# Patient Record
Sex: Female | Born: 1970 | Race: White | Hispanic: No | Marital: Married | Smoking: Never smoker
Health system: Southern US, Community
[De-identification: ages and names within clinical notes are randomized; demographics above are authoritative.]

## PROBLEM LIST (undated history)

## (undated) DIAGNOSIS — B009 Herpesviral infection, unspecified: Secondary | ICD-10-CM

## (undated) DIAGNOSIS — C801 Malignant (primary) neoplasm, unspecified: Secondary | ICD-10-CM

## (undated) DIAGNOSIS — F329 Major depressive disorder, single episode, unspecified: Secondary | ICD-10-CM

## (undated) DIAGNOSIS — F32A Depression, unspecified: Secondary | ICD-10-CM

## (undated) HISTORY — DX: Depression, unspecified: F32.A

## (undated) HISTORY — PX: WISDOM TOOTH EXTRACTION: SHX21

## (undated) HISTORY — PX: SHOULDER SURGERY: SHX246

## (undated) HISTORY — PX: HIP SURGERY: SHX245

## (undated) HISTORY — DX: Major depressive disorder, single episode, unspecified: F32.9

## (undated) HISTORY — DX: Herpesviral infection, unspecified: B00.9

---

## 2001-03-13 HISTORY — PX: LAPAROSCOPIC ABDOMINAL EXPLORATION: SHX6249

## 2003-03-14 HISTORY — PX: ABDOMINAL HYSTERECTOMY: SHX81

## 2003-11-30 ENCOUNTER — Encounter: Admission: RE | Admit: 2003-11-30 | Discharge: 2003-11-30 | Payer: Self-pay | Admitting: Occupational Medicine

## 2004-08-10 ENCOUNTER — Emergency Department: Payer: Self-pay | Admitting: Emergency Medicine

## 2010-03-13 HISTORY — PX: COLONOSCOPY: SHX174

## 2010-03-29 ENCOUNTER — Ambulatory Visit: Payer: Self-pay | Admitting: Unknown Physician Specialty

## 2010-04-11 ENCOUNTER — Ambulatory Visit: Payer: Self-pay | Admitting: Gastroenterology

## 2011-07-21 ENCOUNTER — Ambulatory Visit: Payer: Self-pay | Admitting: Family Medicine

## 2011-10-31 ENCOUNTER — Emergency Department: Payer: Self-pay | Admitting: Emergency Medicine

## 2011-12-21 ENCOUNTER — Ambulatory Visit: Payer: Self-pay | Admitting: Unknown Physician Specialty

## 2014-09-10 DIAGNOSIS — F5101 Primary insomnia: Secondary | ICD-10-CM | POA: Insufficient documentation

## 2015-04-07 ENCOUNTER — Inpatient Hospital Stay
Admission: RE | Admit: 2015-04-07 | Discharge: 2015-04-07 | Disposition: A | Discharge status: Home / Self Care | Payer: Self-pay | Source: Ambulatory Visit | Attending: *Deleted | Admitting: *Deleted

## 2015-04-07 ENCOUNTER — Other Ambulatory Visit: Payer: Self-pay | Admitting: *Deleted

## 2015-04-07 DIAGNOSIS — Z9289 Personal history of other medical treatment: Secondary | ICD-10-CM

## 2015-04-08 ENCOUNTER — Other Ambulatory Visit: Payer: Self-pay | Admitting: Obstetrics and Gynecology

## 2015-04-08 DIAGNOSIS — R928 Other abnormal and inconclusive findings on diagnostic imaging of breast: Secondary | ICD-10-CM

## 2015-04-09 ENCOUNTER — Ambulatory Visit
Admission: RE | Admit: 2015-04-09 | Discharge: 2015-04-09 | Disposition: A | Discharge status: Home / Self Care | Payer: 59 | Source: Ambulatory Visit | Attending: Obstetrics and Gynecology | Admitting: Obstetrics and Gynecology

## 2015-04-09 ENCOUNTER — Other Ambulatory Visit: Payer: Self-pay | Admitting: Obstetrics and Gynecology

## 2015-04-09 DIAGNOSIS — N6002 Solitary cyst of left breast: Secondary | ICD-10-CM | POA: Diagnosis not present

## 2015-04-09 DIAGNOSIS — R928 Other abnormal and inconclusive findings on diagnostic imaging of breast: Secondary | ICD-10-CM

## 2015-04-09 DIAGNOSIS — N63 Unspecified lump in breast: Secondary | ICD-10-CM | POA: Diagnosis not present

## 2016-07-04 ENCOUNTER — Other Ambulatory Visit: Payer: Self-pay

## 2016-07-04 ENCOUNTER — Encounter: Payer: Self-pay | Admitting: Obstetrics and Gynecology

## 2016-07-04 MED ORDER — VALACYCLOVIR HCL 1 G PO TABS: ORAL_TABLET | ORAL | 0 refills | Status: DC

## 2016-07-07 ENCOUNTER — Other Ambulatory Visit: Payer: Self-pay | Admitting: Obstetrics & Gynecology

## 2016-07-07 ENCOUNTER — Other Ambulatory Visit: Payer: Self-pay | Admitting: Obstetrics and Gynecology

## 2016-07-07 DIAGNOSIS — F332 Major depressive disorder, recurrent severe without psychotic features: Secondary | ICD-10-CM

## 2016-07-07 MED ORDER — VENLAFAXINE HCL ER 75 MG PO CP24: 225.0000 mg | ORAL_CAPSULE | Freq: Every day | ORAL | 0 refills | Status: DC

## 2016-07-10 ENCOUNTER — Ambulatory Visit (INDEPENDENT_AMBULATORY_CARE_PROVIDER_SITE_OTHER): Payer: 59 | Admitting: Obstetrics and Gynecology

## 2016-07-10 ENCOUNTER — Encounter: Payer: Self-pay | Admitting: Obstetrics and Gynecology

## 2016-07-10 ENCOUNTER — Telehealth: Payer: Self-pay | Admitting: Obstetrics and Gynecology

## 2016-07-10 VITALS — Ht 60.0 in | Wt 150.0 lb

## 2016-07-10 DIAGNOSIS — Z1389 Encounter for screening for other disorder: Secondary | ICD-10-CM

## 2016-07-10 DIAGNOSIS — F332 Major depressive disorder, recurrent severe without psychotic features: Secondary | ICD-10-CM

## 2016-07-10 DIAGNOSIS — Z01419 Encounter for gynecological examination (general) (routine) without abnormal findings: Secondary | ICD-10-CM

## 2016-07-10 DIAGNOSIS — Z1231 Encounter for screening mammogram for malignant neoplasm of breast: Secondary | ICD-10-CM | POA: Diagnosis not present

## 2016-07-10 DIAGNOSIS — F331 Major depressive disorder, recurrent, moderate: Secondary | ICD-10-CM | POA: Diagnosis not present

## 2016-07-10 DIAGNOSIS — Z1339 Encounter for screening examination for other mental health and behavioral disorders: Secondary | ICD-10-CM

## 2016-07-10 DIAGNOSIS — Z1239 Encounter for other screening for malignant neoplasm of breast: Secondary | ICD-10-CM

## 2016-07-10 MED ORDER — VENLAFAXINE HCL ER 75 MG PO CP24: 225.0000 mg | ORAL_CAPSULE | Freq: Every day | ORAL | 3 refills | Status: DC

## 2016-07-10 NOTE — Progress Notes (Signed)
Gynecology Annual Exam  PCP: [facility]  Chief Complaint: Annual Gynecologic Examination  History of Present Illness:  Ms. Hannah Dickerson is a 46 y.o. G2P2 who LMP was No LMP recorded. Patient has had a hysterectomy., presents today for her annual examination.  Her menses are absent due to hysterectomy  She does have vasomotor sx. She uses no meds.  She is single partner, contraception - status post hysterectomy. She does not have vaginal dryness.  Last Pap: prior to hysterectomy, normal Hx of STDs: none  Last mammogram: 1 year ago, BiRads 2 There is no FH of breast cancer. There is no FH of ovarian cancer. The patient does not do self-breast exams.  Colonoscopy: n/a DEXA: has not been screened for osteoporosis  Tobacco use: The patient denies current or previous tobacco use. Alcohol use: social drinker Exercise: moderately active  The patient wears seatbelts: yes.     Review of Systems: Review of Systems  Constitutional: Negative.   HENT: Negative.   Eyes: Negative.   Respiratory: Negative.   Cardiovascular: Negative.   Gastrointestinal: Negative.   Genitourinary: Negative.   Musculoskeletal: Negative.   Skin: Negative.   Neurological: Negative.   Psychiatric/Behavioral: Negative.    Past Medical History:  Diagnosis Date  . Depression   . HSV-1 (herpes simplex virus 1) infection     Past Surgical History:  Procedure Laterality Date  . ABDOMINAL HYSTERECTOMY  2005   TAH-RSO  . COLONOSCOPY  2012  . HIP SURGERY    . LAPAROSCOPIC ABDOMINAL EXPLORATION  2003   CAK..bladder flap/(R) adnexal adhesions  . SHOULDER SURGERY    . WISDOM TOOTH EXTRACTION      Medications:   Medication Sig Start Date End Date Taking? Authorizing Provider  Ascorbic Acid (VITAMIN C) 1000 MG tablet Take by mouth.    Historical Provider, MD  Digestive Enzymes (ENZYME DIGEST PO) Take by mouth.    Historical Provider, MD  Ferrous Fumarate 29 MG TABS Take by mouth.     Historical Provider, MD  Nyoka Cowden Tea 150 MG CAPS Take by mouth.    Historical Provider, MD  Lactobacillus-Inulin ([place] PO) Take by mouth.    Historical Provider, MD  Magnesium Carbonate (MAGNESIUM GLUCONATE) 54mg /52ml syringe Take by mouth.    Historical Provider, MD  Nutritional Supplements (ESTROVEN PO) Take by mouth.    Historical Provider, MD  Omega-3 Fatty Acids (FISH OIL) 1000 MG CAPS Take by mouth.    Historical Provider, MD  pyridOXINE (VITAMIN B-6) 100 MG tablet Take by mouth.    Historical Provider, MD  valACYclovir (VALTREX) 1000 MG tablet as needed. 07/04/16   Will Bonnet, MD  venlafaxine XR (EFFEXOR-XR) 75 MG 24 hr capsule Take 3 capsules (225 mg total) by mouth daily. 07/07/16   Will Bonnet, MD  vitamin B-12 (CYANOCOBALAMIN) 1000 MCG tablet Take by mouth.    Historical Provider, MD  vitamin E 100 UNIT capsule Take by mouth.    Historical Provider, MD    Allergies  Allergen Reactions  . Codeine Nausea Only  . Ibuprofen Other (See Comments)    PATIENT HAS SEIZURES WITH IBUPROFEN AND ADVIL AND REALTED  . Oxycodone Nausea Only and Other (See Comments)    Migraines  . Tramadol Other (See Comments)    tremors    Gynecologic History:  No LMP recorded. Patient has had a hysterectomy.  Obstetric History: G2P2  Family History: Denies history of gynecologic cancer  Social History   Social History  .  Marital status: Married    Spouse name: N/A  . Number of children: N/A  . Years of education: N/A   Occupational History  . Not on file.   Social History Main Topics  . Smoking status: Never Smoker  . Smokeless tobacco: Never Used  . Alcohol use Yes  . Drug use: No  . Sexual activity: Yes    Birth control/ protection: Surgical   Other Topics Concern  . Not on file   Social History Narrative  . No narrative on file    Physical Exam Ht 5' (1.524 m)   Wt 150 lb (68 kg)   BMI 29.29 kg/m   Physical Exam  Constitutional: She is  oriented to person, place, and time. She appears well-developed and well-nourished. No distress.  Genitourinary: Pelvic exam was performed with patient supine. There is no rash, tenderness, lesion or injury on the right labia. There is no rash, tenderness, lesion or injury on the left labia. No erythema, tenderness or bleeding in the vagina. No foreign body in the vagina. No signs of injury around the vagina. No vaginal discharge found. Right adnexum does not display mass, does not display tenderness and does not display fullness. Left adnexum does not display mass, does not display tenderness and does not display fullness.  Genitourinary Comments: Uterus and cervix: surgically absent  HENT:  Head: Normocephalic and atraumatic.  Eyes: EOM are normal. No scleral icterus.  Neck: Normal range of motion. Neck supple. No thyromegaly present.  Cardiovascular: Normal rate and regular rhythm.  Exam reveals no gallop and no friction rub.   No murmur heard. Pulmonary/Chest: Effort normal and breath sounds normal. No respiratory distress. She has no wheezes. She has no rales. Right breast exhibits no inverted nipple, no mass, no nipple discharge, no skin change and no tenderness. Left breast exhibits no inverted nipple, no mass, no nipple discharge, no skin change and no tenderness.  Abdominal: Soft. Bowel sounds are normal. She exhibits no distension and no mass. There is no tenderness. There is no rebound and no guarding.  Musculoskeletal: Normal range of motion. She exhibits no edema or tenderness.  Lymphadenopathy:    She has no cervical adenopathy.       Right: No inguinal adenopathy present.       Left: No inguinal adenopathy present.  Neurological: She is alert and oriented to person, place, and time. No cranial nerve deficit.  Skin: Skin is warm and dry. No rash noted. No erythema.  Psychiatric: She has a normal mood and affect. Her behavior is normal. Judgment normal.   Female chaperone present for  pelvic and breast  portions of the physical exam  Results: AUDIT Questionnaire (screen for alcoholism): 3 PHQ-9: 4   Assessment: 46 y.o. G2P2 female here for routine annual gynecologic examination, doing well.  Depression, well controlled on current medication.  Plan: Screening: -- Blood pressure screen normal. -- Colonoscopy - not due -- Mammogram - due. Patient to get at [facility]. Order placed. She understands that she needs to call to arrange. -- Weight screening: normal -- Depression: well-controlled. Continue current therapy. Will consider lowering dose at next visit. She does not want to try this today. -- Nutrition: normal -- cholesterol screening: n/a -- osteoporosis screening: n/a -- tobacco screening: not using -- alcohol screening: AUDIT questionnaire indicates low-risk usage. -- family history of breast cancer screening: done. not at high risk. -- no evidence of domestic violence or intimate partner violence. -- STD screening: gonorrhea/chlamydia NAAT not collected. --  pap smear not collected due to history of hysterectomy for benign indications.  Prentice Docker, MD 07/10/2016 3:21 PM

## 2016-07-10 NOTE — Telephone Encounter (Signed)
Pt called after hour line on 07/07/16 at 10:04. Caller states MD told her on Mychart that effexor was sent in to mail order pharmacy but it is not at pharmcy.Pt is out of effexor.Marland Kitchen Please call.

## 2016-07-11 ENCOUNTER — Telehealth: Payer: Self-pay | Admitting: Obstetrics & Gynecology

## 2016-07-11 NOTE — Telephone Encounter (Signed)
Medication updated by Dr Glennon Mac 07/10/16

## 2016-07-11 NOTE — Telephone Encounter (Signed)
[facility] is calling needing to verification an medication prescribed by Dr. Kenton Kingfisher. Please call. Reference number [id number]

## 2016-12-20 ENCOUNTER — Telehealth: Payer: Self-pay | Admitting: Obstetrics and Gynecology

## 2016-12-20 NOTE — Telephone Encounter (Signed)
[facility] is calling due to an prior authorization for Valacyclovir 500mg . Please advise. Ref # [id number]

## 2016-12-21 NOTE — Telephone Encounter (Signed)
[facility] states no PA needed

## 2017-01-01 ENCOUNTER — Other Ambulatory Visit: Payer: Self-pay | Admitting: Obstetrics and Gynecology

## 2017-01-01 DIAGNOSIS — B001 Herpesviral vesicular dermatitis: Secondary | ICD-10-CM

## 2017-01-02 DIAGNOSIS — B001 Herpesviral vesicular dermatitis: Secondary | ICD-10-CM | POA: Insufficient documentation

## 2017-01-02 MED ORDER — VALACYCLOVIR HCL 1 G PO TABS: 2000.0000 mg | ORAL_TABLET | Freq: Once | ORAL | 2 refills | Status: AC

## 2017-04-23 ENCOUNTER — Other Ambulatory Visit: Payer: Self-pay | Admitting: Family Medicine

## 2017-04-23 DIAGNOSIS — Z1231 Encounter for screening mammogram for malignant neoplasm of breast: Secondary | ICD-10-CM

## 2017-04-30 ENCOUNTER — Ambulatory Visit
Admission: RE | Admit: 2017-04-30 | Discharge: 2017-04-30 | Disposition: A | Discharge status: Home / Self Care | Payer: 59 | Source: Ambulatory Visit | Attending: Family Medicine | Admitting: Family Medicine

## 2017-04-30 DIAGNOSIS — R928 Other abnormal and inconclusive findings on diagnostic imaging of breast: Secondary | ICD-10-CM | POA: Diagnosis not present

## 2017-04-30 DIAGNOSIS — Z1231 Encounter for screening mammogram for malignant neoplasm of breast: Secondary | ICD-10-CM

## 2017-04-30 HISTORY — DX: Malignant (primary) neoplasm, unspecified: C80.1

## 2017-05-02 ENCOUNTER — Other Ambulatory Visit: Payer: Self-pay | Admitting: Family Medicine

## 2017-05-02 DIAGNOSIS — R928 Other abnormal and inconclusive findings on diagnostic imaging of breast: Secondary | ICD-10-CM

## 2017-05-04 ENCOUNTER — Ambulatory Visit
Admission: RE | Admit: 2017-05-04 | Discharge: 2017-05-04 | Disposition: A | Discharge status: Home / Self Care | Payer: 59 | Source: Ambulatory Visit | Attending: Family Medicine | Admitting: Family Medicine

## 2017-05-04 DIAGNOSIS — N6489 Other specified disorders of breast: Secondary | ICD-10-CM | POA: Diagnosis present

## 2017-05-04 DIAGNOSIS — R928 Other abnormal and inconclusive findings on diagnostic imaging of breast: Secondary | ICD-10-CM | POA: Insufficient documentation

## 2017-05-04 DIAGNOSIS — N6002 Solitary cyst of left breast: Secondary | ICD-10-CM | POA: Diagnosis not present

## 2017-05-30 ENCOUNTER — Other Ambulatory Visit: Payer: Self-pay | Admitting: Obstetrics and Gynecology

## 2017-05-30 DIAGNOSIS — F332 Major depressive disorder, recurrent severe without psychotic features: Secondary | ICD-10-CM

## 2017-05-30 MED ORDER — VENLAFAXINE HCL ER 75 MG PO CP24: 225.0000 mg | ORAL_CAPSULE | Freq: Every day | ORAL | 3 refills | Status: DC

## 2018-06-02 ENCOUNTER — Other Ambulatory Visit: Payer: Self-pay | Admitting: Obstetrics and Gynecology

## 2018-06-02 DIAGNOSIS — F332 Major depressive disorder, recurrent severe without psychotic features: Secondary | ICD-10-CM

## 2018-06-03 NOTE — Telephone Encounter (Signed)
advise

## 2018-06-11 ENCOUNTER — Other Ambulatory Visit: Payer: Self-pay

## 2018-06-11 DIAGNOSIS — F332 Major depressive disorder, recurrent severe without psychotic features: Secondary | ICD-10-CM

## 2018-06-11 MED ORDER — VENLAFAXINE HCL ER 75 MG PO CP24: 225.0000 mg | ORAL_CAPSULE | Freq: Every day | ORAL | 3 refills | Status: DC

## 2018-06-11 NOTE — Telephone Encounter (Signed)
Advise

## 2019-01-20 ENCOUNTER — Other Ambulatory Visit: Payer: Self-pay

## 2019-01-20 ENCOUNTER — Ambulatory Visit (INDEPENDENT_AMBULATORY_CARE_PROVIDER_SITE_OTHER): Payer: 59 | Admitting: Obstetrics and Gynecology

## 2019-01-20 VITALS — BP 124/78 | Ht 61.0 in | Wt 153.0 lb

## 2019-01-20 DIAGNOSIS — N951 Menopausal and female climacteric states: Secondary | ICD-10-CM

## 2019-01-20 NOTE — Progress Notes (Signed)
Obstetrics & Gynecology Office Visit   Chief Complaint  Patient presents with  . Menopause   History of Present Illness: 48 y.o. G2P2 female who presents with menopausal symptoms.  She is status post TAH/RSO.  She has severe hot flashes, night sweats, slow metabolism, vaginal dryness, fatigue, weight gain.  She has just started taking phentermine along with B12 injections and lipo Den.  She states that she is not hungry and that she does not need appetite suppresion.   She states that she has gained about 6 pounds over the past 9 months.  She most wants her hot flashes and vaginal dryness under control.  However, she is greatly worried about her weight, as well.   Past Medical History:  Diagnosis Date  . Cancer ([place])    skin ca on her back  . Depression   . HSV-1 (herpes simplex virus 1) infection     Past Surgical History:  Procedure Laterality Date  . ABDOMINAL HYSTERECTOMY  2005   TAH-RSO  . COLONOSCOPY  2012  . HIP SURGERY    . LAPAROSCOPIC ABDOMINAL EXPLORATION  2003   CAK..bladder flap/(R) adnexal adhesions  . SHOULDER SURGERY    . WISDOM TOOTH EXTRACTION      Gynecologic History: No LMP recorded. Patient has had a hysterectomy.  Obstetric History: G2P2  Family History  Problem Relation Age of Onset  . Breast cancer Neg Hx     Social History   Socioeconomic History  . Marital status: Married    Spouse name: Not on file  . Number of children: Not on file  . Years of education: Not on file  . Highest education level: Not on file  Occupational History  . Not on file  Social Needs  . Financial resource strain: Not on file  . Food insecurity    Worry: Not on file    Inability: Not on file  . Transportation needs    Medical: Not on file    Non-medical: Not on file  Tobacco Use  . Smoking status: Never Smoker  . Smokeless tobacco: Never Used  Substance and Sexual Activity  . Alcohol use: Yes  . Drug use: No  . Sexual activity: Yes    Birth  control/protection: Surgical  Lifestyle  . Physical activity    Days per week: Not on file    Minutes per session: Not on file  . Stress: Not on file  Relationships  . Social Herbalist on phone: Not on file    Gets together: Not on file    Attends religious service: Not on file    Active member of club or organization: Not on file    Attends meetings of clubs or organizations: Not on file    Relationship status: Not on file  . Intimate partner violence    Fear of current or ex partner: Not on file    Emotionally abused: Not on file    Physically abused: Not on file    Forced sexual activity: Not on file  Other Topics Concern  . Not on file  Social History Narrative  . Not on file    Allergies  Allergen Reactions  . Codeine Nausea Only  . Ibuprofen Other (See Comments)    PATIENT HAS SEIZURES WITH IBUPROFEN AND ADVIL AND REALTED  . Oxycodone Nausea Only and Other (See Comments)    Migraines  . Tramadol Other (See Comments)    tremors    Prior to Admission medications  Medication Sig Start Date End Date Taking? Authorizing Provider  Ascorbic Acid (VITAMIN C) 1000 MG tablet Take by mouth.   Yes [provider]  Digestive Enzymes (ENZYME DIGEST PO) Take by mouth.   Yes [provider]  Ferrous Fumarate 29 MG TABS Take by mouth.   Yes [provider]  Nyoka Cowden Tea 150 MG CAPS Take by mouth.   Yes [provider]  Lactobacillus-Inulin ([place] PO) Take by mouth.   Yes [provider]  Magnesium Carbonate (MAGNESIUM GLUCONATE) 54mg /83ml syringe Take by mouth.   Yes [provider]  Omega-3 Fatty Acids (FISH OIL) 1000 MG CAPS Take by mouth.   Yes [provider]  pyridOXINE (VITAMIN B-6) 100 MG tablet Take by mouth.   Yes [provider]  vitamin B-12 (CYANOCOBALAMIN) 1000 MCG tablet Take by mouth.   Yes [provider]  vitamin E 100 UNIT capsule Take by mouth.   Yes  [provider]  Nutritional Supplements (ESTROVEN PO) Take by mouth.    [provider]  venlafaxine XR (EFFEXOR-XR) 75 MG 24 hr capsule Take 3 capsules (225 mg total) by mouth daily. 06/11/18 09/09/18  Will Bonnet, MD    Review of Systems  Constitutional: Negative.   HENT: Negative.   Eyes: Negative.   Respiratory: Negative.   Cardiovascular: Negative.   Gastrointestinal: Negative.   Genitourinary: Negative.   Musculoskeletal: Negative.   Skin: Negative.   Neurological: Negative.   Psychiatric/Behavioral: Negative.      Physical Exam BP 124/78   Ht 5\' 1"  (1.549 m)   Wt 153 lb (69.4 kg)   BMI 28.91 kg/m  No LMP recorded. Patient has had a hysterectomy. Physical Exam Constitutional:      General: She is not in acute distress.    Appearance: Normal appearance.  HENT:     Head: Normocephalic and atraumatic.  Eyes:     General: No scleral icterus.    Conjunctiva/sclera: Conjunctivae normal.  Neurological:     General: No focal deficit present.     Mental Status: She is alert and oriented to person, place, and time.     Cranial Nerves: No cranial nerve deficit.  Psychiatric:        Mood and Affect: Mood normal.        Behavior: Behavior normal.        Judgment: Judgment normal.    Assessment: 48 y.o. G2P2 female here for  1. Climacteric      Plan: Problem List Items Addressed This Visit    None    Visit Diagnoses    Climacteric    -  Primary   Relevant Medications   estradiol (VIVELLE-DOT) 0.0375 MG/24HR (Start on 01/23/2019)     We discussed that she would likely benefit from some sort of medication to reduce her hot flashes and other concurrent symptoms.  She has no contraindications to estrogen.  She has no history of hypertension, liver disease, VTE, smoking.  Given that she has had a hysterectomy, she is eligible for estrogen replacement therapy (ERT).  We discussed the increase in risk of VTE.  She is at an age where women with  similar medical backgrounds would be eligible for oral combined contraceptive pills, which have a way higher dose of estrogen.  We discussed different routes of application of the medication, including; oral, transdermal.  She elects a transdermal delivery method.  We discussed trying to use the lowest possible dose to get the most possible  benefit.  Further, we discussed that she would not remain on this medication for a long time, just long enough to get her symptoms under control for now.  We would have to consider stopping this medication and for 5 years.  For weight loss, she could consider continuing to take phentermine as prescribed by Dr. Ouida Sills.  She may note an increase in energy from this medication and a decrease in weight.  20 minutes spent in face to face discussion with > 50% spent in counseling,management, and coordination of care of her climacteric.   Return in about 2 months (around 03/22/2019) for Annual Gynecologic Examination.   Prentice Docker, MD 01/21/2019 1:52 PM

## 2019-01-21 ENCOUNTER — Encounter: Payer: Self-pay | Admitting: Obstetrics and Gynecology

## 2019-01-21 MED ORDER — ESTRADIOL 0.0375 MG/24HR TD PTTW: 1.0000 | MEDICATED_PATCH | TRANSDERMAL | 0 refills | Status: DC

## 2019-03-11 ENCOUNTER — Other Ambulatory Visit: Payer: Self-pay | Admitting: Obstetrics and Gynecology

## 2019-03-11 DIAGNOSIS — N951 Menopausal and female climacteric states: Secondary | ICD-10-CM

## 2019-03-11 NOTE — Telephone Encounter (Signed)
Please advise 

## 2019-03-20 ENCOUNTER — Other Ambulatory Visit: Payer: Self-pay

## 2019-03-20 ENCOUNTER — Ambulatory Visit (INDEPENDENT_AMBULATORY_CARE_PROVIDER_SITE_OTHER): Payer: 59 | Admitting: Obstetrics and Gynecology

## 2019-03-20 ENCOUNTER — Encounter: Payer: Self-pay | Admitting: Obstetrics and Gynecology

## 2019-03-20 VITALS — BP 139/91 | Ht 61.0 in | Wt 152.0 lb

## 2019-03-20 DIAGNOSIS — N951 Menopausal and female climacteric states: Secondary | ICD-10-CM

## 2019-03-20 NOTE — Progress Notes (Signed)
Obstetrics & Gynecology Office Visit   Chief Complaint  Patient presents with  . Follow-up  Medication  History of Present Illness: 49 y.o. G2P2 female who presents in follow up from starting the estrogen patch.  Her hot flashes at night have just about disappeared.  She notes her stress level has been more steady.  She has not noted any vaginal dryness. The dryness is about the same.  Her night sweats have improved and the headaches have improved.  She would like to continue with the medication for a while.   Past Medical History:  Diagnosis Date  . Cancer ([place])    skin ca on her back  . Depression   . HSV-1 (herpes simplex virus 1) infection     Past Surgical History:  Procedure Laterality Date  . ABDOMINAL HYSTERECTOMY  2005   TAH-RSO  . COLONOSCOPY  2012  . HIP SURGERY    . LAPAROSCOPIC ABDOMINAL EXPLORATION  2003   CAK..bladder flap/(R) adnexal adhesions  . SHOULDER SURGERY    . WISDOM TOOTH EXTRACTION      Gynecologic History: No LMP recorded. Patient has had a hysterectomy.  Obstetric History: G2P2  Family History  Problem Relation Age of Onset  . Breast cancer Neg Hx     Social History   Socioeconomic History  . Marital status: Married    Spouse name: Not on file  . Number of children: Not on file  . Years of education: Not on file  . Highest education level: Not on file  Occupational History  . Not on file  Tobacco Use  . Smoking status: Never Smoker  . Smokeless tobacco: Never Used  Substance and Sexual Activity  . Alcohol use: Yes  . Drug use: No  . Sexual activity: Yes    Birth control/protection: Surgical    Comment: Hysterectomy  Other Topics Concern  . Not on file  Social History Narrative  . Not on file   Social Determinants of Health   Financial Resource Strain:   . Difficulty of Paying Living Expenses: Not on file  Food Insecurity:   . Worried About Charity fundraiser in the Last Year: Not on file  . Ran Out of Food in the Last  Year: Not on file  Transportation Needs:   . Lack of Transportation (Medical): Not on file  . Lack of Transportation (Non-Medical): Not on file  Physical Activity:   . Days of Exercise per Week: Not on file  . Minutes of Exercise per Session: Not on file  Stress:   . Feeling of Stress : Not on file  Social Connections:   . Frequency of Communication with Friends and Family: Not on file  . Frequency of Social Gatherings with Friends and Family: Not on file  . Attends Religious Services: Not on file  . Active Member of Clubs or Organizations: Not on file  . Attends Archivist Meetings: Not on file  . Marital Status: Not on file  Intimate Partner Violence:   . Fear of Current or Ex-Partner: Not on file  . Emotionally Abused: Not on file  . Physically Abused: Not on file  . Sexually Abused: Not on file    Allergies  Allergen Reactions  . Codeine Nausea Only  . Ibuprofen Other (See Comments)    PATIENT HAS SEIZURES WITH IBUPROFEN AND ADVIL AND REALTED  . Oxycodone Nausea Only and Other (See Comments)    Migraines  . Tramadol Other (See Comments)  tremors    Prior to Admission medications   Medication Sig Start Date End Date Taking? Authorizing Provider  Ascorbic Acid (VITAMIN C) 1000 MG tablet Take by mouth.   Yes [provider]  buPROPion (WELLBUTRIN SR) 100 MG 12 hr tablet Take by mouth. 09/16/18  Yes [provider]  Digestive Enzymes (ENZYME DIGEST PO) Take by mouth.   Yes [provider]  phentermine (ADIPEX-P) 37.5 MG tablet Take 37.5 mg by mouth every morning. 01/13/19  Yes [provider]  pyridOXINE (VITAMIN B-6) 100 MG tablet Take by mouth.   Yes [provider]  valACYclovir (VALTREX) 1000 MG tablet as needed. As needed for cold sores. 05/10/12  Yes [provider]  vitamin B-12 (CYANOCOBALAMIN) 1000 MCG tablet Take by mouth.   Yes [provider]  VIVELLE-DOT 0.0375 MG/24HR APPLY 1 PATCH  TOPICALLY TO  SKIN TWICE WEEKLY 03/11/19  Yes Will Bonnet, MD  venlafaxine XR (EFFEXOR-XR) 75 MG 24 hr capsule Take 3 capsules (225 mg total) by mouth daily. 06/11/18 09/09/18  Will Bonnet, MD    Review of Systems  Constitutional: Negative.   HENT: Negative.   Eyes: Negative.   Respiratory: Negative.   Cardiovascular: Negative.   Gastrointestinal: Negative.   Genitourinary: Negative.   Musculoskeletal: Negative.   Skin: Negative.   Neurological: Negative.   Psychiatric/Behavioral: Negative.      Physical Exam BP (!) 139/91   Ht 5\' 1"  (1.549 m)   Wt 152 lb (68.9 kg)   BMI 28.72 kg/m  No LMP recorded. Patient has had a hysterectomy. Physical Exam Constitutional:      General: She is not in acute distress.    Appearance: Normal appearance.  HENT:     Head: Normocephalic and atraumatic.  Eyes:     General: No scleral icterus.    Conjunctiva/sclera: Conjunctivae normal.  Neurological:     General: No focal deficit present.     Mental Status: She is alert and oriented to person, place, and time.     Cranial Nerves: No cranial nerve deficit.  Psychiatric:        Mood and Affect: Mood normal.        Behavior: Behavior normal.        Judgment: Judgment normal.    Female chaperone present for pelvic and breast  portions of the physical exam  Assessment: 49 y.o. G2P2 female here for  1. Climacteric      Plan: Problem List Items Addressed This Visit    None    Visit Diagnoses    Climacteric    -  Primary     Continue current medication.   15 minutes spent in face to face discussion with > 50% spent in counseling,management, and coordination of care of her climacteric.   Prentice Docker, MD 03/20/2019 4:42 PM

## 2019-06-05 ENCOUNTER — Other Ambulatory Visit: Payer: Self-pay | Admitting: Obstetrics and Gynecology

## 2019-06-05 DIAGNOSIS — F332 Major depressive disorder, recurrent severe without psychotic features: Secondary | ICD-10-CM

## 2019-06-05 NOTE — Telephone Encounter (Signed)
Please advise 

## 2019-12-25 ENCOUNTER — Other Ambulatory Visit: Payer: Self-pay | Admitting: Obstetrics and Gynecology

## 2019-12-25 DIAGNOSIS — N951 Menopausal and female climacteric states: Secondary | ICD-10-CM

## 2020-04-07 ENCOUNTER — Other Ambulatory Visit: Payer: Self-pay | Admitting: Obstetrics and Gynecology

## 2020-04-07 DIAGNOSIS — F332 Major depressive disorder, recurrent severe without psychotic features: Secondary | ICD-10-CM

## 2020-04-23 ENCOUNTER — Other Ambulatory Visit: Payer: Self-pay | Admitting: Obstetrics and Gynecology

## 2020-04-23 DIAGNOSIS — F332 Major depressive disorder, recurrent severe without psychotic features: Secondary | ICD-10-CM

## 2020-11-05 ENCOUNTER — Other Ambulatory Visit: Payer: Self-pay | Admitting: Obstetrics and Gynecology

## 2020-11-05 DIAGNOSIS — N951 Menopausal and female climacteric states: Secondary | ICD-10-CM

## 2021-04-06 ENCOUNTER — Ambulatory Visit (INDEPENDENT_AMBULATORY_CARE_PROVIDER_SITE_OTHER): Payer: 59 | Admitting: Advanced Practice Midwife

## 2021-04-06 ENCOUNTER — Other Ambulatory Visit: Payer: Self-pay

## 2021-04-06 ENCOUNTER — Ambulatory Visit: Payer: 59 | Admitting: Advanced Practice Midwife

## 2021-04-06 ENCOUNTER — Encounter: Payer: Self-pay | Admitting: Advanced Practice Midwife

## 2021-04-06 VITALS — BP 120/80 | Ht 61.0 in | Wt 141.0 lb

## 2021-04-06 DIAGNOSIS — Z1239 Encounter for other screening for malignant neoplasm of breast: Secondary | ICD-10-CM | POA: Diagnosis not present

## 2021-04-06 DIAGNOSIS — N951 Menopausal and female climacteric states: Secondary | ICD-10-CM | POA: Diagnosis not present

## 2021-04-06 DIAGNOSIS — Z01419 Encounter for gynecological examination (general) (routine) without abnormal findings: Secondary | ICD-10-CM

## 2021-04-06 DIAGNOSIS — N3946 Mixed incontinence: Secondary | ICD-10-CM | POA: Diagnosis not present

## 2021-04-06 MED ORDER — ESTRADIOL 0.0375 MG/24HR TD PTTW: 1.0000 | MEDICATED_PATCH | TRANSDERMAL | 11 refills | Status: AC

## 2021-04-06 NOTE — Progress Notes (Signed)
Gynecology Annual Exam  PCP: Gayland Curry, MD  Chief Complaint:  Chief Complaint  Patient presents with   Annual Exam    History of Present Illness:Patient is a 51 y.o. G2P2 presents for annual exam. The patient has no gyn complaints today. Her main concern is urinary incontinence  that she has experienced in recent years. The urine leakage is primarily stress induced- exercise, running with her dog, etc. She reports doing daily kegel exercises.  LMP: No LMP recorded. Patient has had a hysterectomy.   The patient is sexually active. She denies dyspareunia.  The patient does perform self breast exams.  There is no notable family history of breast or ovarian cancer in her family.  The patient wears seatbelts: yes.   The patient has regular exercise:  she works out 4 days per week, she admits healthy diet and adequate hydration. She usually only has 4 hours of sleep due to racing thoughts. She has a history of insomnia .    The patient denies current symptoms of depression. Current medication dosing is effective.  Review of Systems: Review of Systems  Constitutional:  Negative for chills and fever.  HENT:  Negative for congestion, ear discharge, ear pain, hearing loss, sinus pain and sore throat.   Eyes:  Negative for blurred vision and double vision.  Respiratory:  Negative for cough, shortness of breath and wheezing.   Cardiovascular:  Negative for chest pain, palpitations and leg swelling.  Gastrointestinal:  Negative for abdominal pain, blood in stool, constipation, diarrhea, heartburn, melena, nausea and vomiting.  Genitourinary:  Positive for frequency and urgency. Negative for dysuria, flank pain and hematuria.  Musculoskeletal:  Positive for joint pain. Negative for back pain and myalgias.  Skin:  Negative for itching and rash.  Neurological:  Positive for headaches. Negative for dizziness, tingling, tremors, sensory change, speech change, focal weakness, seizures,  loss of consciousness and weakness.  Endo/Heme/Allergies:  Negative for environmental allergies. Bruises/bleeds easily.       Positive for hot flashes  Psychiatric/Behavioral:  Positive for depression. Negative for hallucinations, memory loss, substance abuse and suicidal ideas. The patient is not nervous/anxious and does not have insomnia.        Positive for anxiety   Past Medical History:  Patient Active Problem List   Diagnosis Date Noted   Herpes labialis 01/02/2017   Primary insomnia 09/10/2014   Depression 10/24/2012    Formatting of this note might be different from the original. With anxiety     Past Surgical History:  Past Surgical History:  Procedure Laterality Date   ABDOMINAL HYSTERECTOMY  2005   TAH-RSO   COLONOSCOPY  2012   HIP SURGERY     LAPAROSCOPIC ABDOMINAL EXPLORATION  2003   Martin..bladder flap/(R) adnexal adhesions   SHOULDER SURGERY     WISDOM TOOTH EXTRACTION      Gynecologic History:  No LMP recorded. Patient has had a hysterectomy. Last Pap: remote history/discontinued since hysterectomy  Obstetric History: G2P2  Family History:  Family History  Problem Relation Age of Onset   Breast cancer Neg Hx     Social History:  Social History   Socioeconomic History   Marital status: Married    Spouse name: Not on file   Number of children: Not on file   Years of education: Not on file   Highest education level: Not on file  Occupational History   Not on file  Tobacco Use   Smoking status: Never   Smokeless  tobacco: Never  Vaping Use   Vaping Use: Never used  Substance and Sexual Activity   Alcohol use: Yes   Drug use: No   Sexual activity: Yes    Birth control/protection: Surgical    Comment: Hysterectomy  Other Topics Concern   Not on file  Social History Narrative   Not on file   Social Determinants of Health   Financial Resource Strain: Not on file  Food Insecurity: Not on file  Transportation Needs: Not on file  Physical  Activity: Not on file  Stress: Not on file  Social Connections: Not on file  Intimate Partner Violence: Not on file    Allergies:  Allergies  Allergen Reactions   Codeine Nausea Only   Ibuprofen Other (See Comments)    PATIENT HAS SEIZURES WITH IBUPROFEN AND ADVIL AND REALTED   Oxycodone Nausea Only and Other (See Comments)    Migraines   Pseudoephedrine Hcl Other (See Comments)   Tramadol Other (See Comments)    tremors    Medications: Prior to Admission medications   Medication Sig Start Date End Date Taking? Authorizing Provider  buPROPion (WELLBUTRIN SR) 100 MG 12 hr tablet Take by mouth. 09/16/18  Yes [provider]  estradiol (VIVELLE-DOT) 0.0375 MG/24HR Place 1 patch onto the skin 2 (two) times a week. 04/07/21  Yes Rod Can, CNM  phentermine (ADIPEX-P) 37.5 MG tablet Take 37.5 mg by mouth every morning. 01/13/19  Yes [provider]  valACYclovir (VALTREX) 1000 MG tablet as needed. As needed for cold sores. 05/10/12  Yes [provider]  venlafaxine XR (EFFEXOR-XR) 75 MG 24 hr capsule TAKE 3 CAPSULES BY MOUTH  DAILY 04/27/20  Yes Will Bonnet, MD    Physical Exam Vitals: Blood pressure 120/80, height 5\' 1"  (1.549 m), weight 141 lb (64 kg).  General: NAD HEENT: normocephalic, anicteric Thyroid: no enlargement, no palpable nodules Pulmonary: No increased work of breathing, CTAB Cardiovascular: RRR, distal pulses 2+ Breast: Breast symmetrical, no tenderness, no palpable nodules or masses, no skin or nipple retraction present, no nipple discharge.  No axillary or supraclavicular lymphadenopathy. Abdomen: NABS, soft, non-tender, non-distended.  Umbilicus without lesions.  No hepatomegaly, splenomegaly or masses palpable. No evidence of hernia  Genitourinary:  External: Normal external female genitalia.  Normal urethral meatus, normal Bartholin's and Skene's glands.    Vagina: Normal vaginal mucosa, no evidence of prolapse, decreased muscle  tone   Cervix: surgically absent  Uterus: surgically absent  Adnexa: deferred (has left ovary per her report) Extremities: no edema, erythema, or tenderness Neurologic: Grossly intact Psychiatric: mood appropriate, affect full    Assessment: 51 y.o. G2P2 routine annual exam  Plan: Problem List Items Addressed This Visit   None Visit Diagnoses     Well woman exam with routine gynecological exam    -  Primary   Relevant Medications   estradiol (VIVELLE-DOT) 0.0375 MG/24HR (Start on 04/07/2021)   Breast screening       Relevant Orders   MM 3D SCREEN BREAST BILATERAL   Mixed stress and urge urinary incontinence       Relevant Orders   Ambulatory referral to Physical Therapy   Vasomotor symptoms due to menopause       Relevant Medications   estradiol (VIVELLE-DOT) 0.0375 MG/24HR (Start on 04/07/2021)       1) Mammogram - recommend yearly screening mammogram.  Mammogram Was ordered today  2) STI screening  was offered and declined  3) ASCCP guidelines and rationale discussed.  Patient  opts for discontinue secondary to prior hysterectomy screening interval  4) Osteoporosis  - per USPTF routine screening DEXA at age 45  Consider FDA-approved medical therapies in postmenopausal women and men aged 77 years and older, based on the following: a) A hip or vertebral (clinical or morphometric) fracture b) T-score ? -2.5 at the femoral neck or spine after appropriate evaluation to exclude secondary causes C) Low bone mass (T-score between -1.0 and -2.5 at the femoral neck or spine) and a 10-year probability of a hip fracture ? 3% or a 10-year probability of a major osteoporosis-related fracture ? 20% based on the [place]-adapted WHO algorithm   5) Routine healthcare maintenance including cholesterol, diabetes screening discussed managed by PCP  6) Colonoscopy: she has had previous colonoscopies for history of constipation and declines referral for screening at this time.  Screening  recommended starting at age 14 for average risk individuals, age 64 for individuals deemed at increased risk (including African Americans) and recommended to continue until age 19.  For patient age 40-85 individualized approach is recommended.  Gold standard screening is via colonoscopy, Cologuard screening is an acceptable alternative for patient unwilling or unable to undergo colonoscopy.  "Colorectal cancer screening for average?risk adults: 2018 guideline update from the American Cancer Society"[place]: A Cancer Journal for Clinicians: Aug 09, 2016   7) Urinary incontinence: Referral to PT for pelvic floor PT. Follow up as needed with Urology.  8) Return in about 1 year (around 04/06/2022) for annual established gyn.   Christean Leaf, CNM [facility] 04/06/21, 10:21 AM

## 2021-04-06 NOTE — Patient Instructions (Signed)
Urinary Incontinence °Urinary incontinence refers to a condition in which a person is unable to control where and when to pass urine. A person with this condition will urinate involuntarily. This means that the person urinates when he or she does not mean to. °What are the causes? °This condition may be caused by: °Medicines. °Infections. °Constipation. °Overactive bladder muscles. °Weak bladder muscles. °Weak pelvic floor muscles. These muscles provide support for the bladder, intestine, and, in women, the uterus. °Enlarged prostate in men. The prostate is a gland near the bladder. When it gets too big, it can pinch the urethra. With the urethra blocked, the bladder can weaken and lose the ability to empty properly. °Surgery. °Emotional factors, such as anxiety, stress, or post-traumatic stress disorder (PTSD). °Spinal cord injury, nerve injury, or other neurological conditions. °Pelvic organ prolapse. This happens in women when organs move out of place and into the vagina. This movement can prevent the bladder and urethra from working properly. °What increases the risk? °The following factors may make you more likely to develop this condition: °Age. The older you are, the higher the risk. °Obesity. °Being physically inactive. °Pregnancy and childbirth. °Menopause. °Diseases that affect the nerves or spinal cord. °Long-term, or chronic, coughing. This can increase pressure on the bladder and pelvic floor muscles. °What are the signs or symptoms? °Symptoms may vary depending on the type of urinary incontinence you have. They include: °A sudden urge to urinate, and passing urine involuntarily before you can get to a bathroom (urge incontinence). °Suddenly passing urine when doing activities that force urine to pass, such as coughing, laughing, exercising, or sneezing (stress incontinence). °Needing to urinate often but urinating only a small amount, or constantly dribbling urine (overflow incontinence). °Urinating  because you cannot get to the bathroom in time due to a physical disability, such as arthritis or injury, or due to a communication or thinking problem, such as Alzheimer's disease (functional incontinence). °How is this diagnosed? °This condition may be diagnosed based on: °Your medical history. °A physical exam. °Tests, such as: °Urine tests. °X-rays of your kidney and bladder. °Ultrasound. °CT scan. °Cystoscopy. In this procedure, a health care provider inserts a tube with a light and camera (cystoscope) through the urethra and into the bladder to check for problems. °Urodynamic testing. These tests assess how well the bladder, urethra, and sphincter can store and release urine. There are different types of urodynamic tests, and they vary depending on what the test is measuring. °To help diagnose your condition, your health care provider may recommend that you keep a log of when you urinate and how much you urinate. °How is this treated? °Treatment for this condition depends on the type of incontinence that you have and its cause. Treatment may include: °Lifestyle changes, such as: °Quitting smoking. °Maintaining a healthy weight. °Staying active. Try to get 150 minutes of moderate-intensity exercise every week. Ask your health care provider which activities are safe for you. °Eating a healthy diet. °Avoid high-fat foods, like fried foods. °Avoid refined carbohydrates like white bread and white rice. °Limit how much alcohol and caffeine you drink. °Increase your fiber intake. Healthy sources of fiber include beans, whole grains, and fresh fruits and vegetables. °Behavioral changes, such as: °Pelvic floor muscle exercises. °Bladder training, such as lengthening the amount of time between bathroom breaks, or using the bathroom at regular intervals. °Using techniques to suppress bladder urges. This can include distraction techniques or controlled breathing exercises. °Medicines, such as: °Medicines to relax the  bladder   muscles and prevent bladder spasms. °Medicines to help slow or prevent the growth of a man's prostate. °Botox injections. These can help relax the bladder muscles. °Treatments, such as: °Using pulses of electricity to help change bladder reflexes (electrical nerve stimulation). °For women, using a medical device to prevent urine leaks. This is a small, tampon-like, disposable device that is inserted into the urethra. °Injecting collagen or carbon beads (bulking agents) into the urinary sphincter. These can help thicken tissue and close the bladder opening. °Surgery. °Follow these instructions at home: °Lifestyle °Limit alcohol and caffeine. These can fill your bladder quickly and irritate it. °Keep yourself clean to help prevent odors and skin damage. Ask your health care provider about special skin creams and cleansers that can protect the skin from urine. °Consider wearing pads or adult diapers. Make sure to change them regularly, and always change them right after experiencing incontinence. °General instructions °Take over-the-counter and prescription medicines only as told by your health care provider. °Use the bathroom about every 3-4 hours, even if you do not feel the need to urinate. Try to empty your bladder completely every time. After urinating, wait a minute. Then try to urinate again. °Make sure you are in a relaxed position while urinating. °If your incontinence is caused by nerve problems, keep a log of the medicines you take and the times you go to the bathroom. °Keep all follow-up visits. This is important. °Where to find more information °National Institute of Diabetes and Digestive and Kidney Diseases: www.niddk.nih.gov °American Urology Association: www.urologyhealth.org °Contact a health care provider if: °You have pain that gets worse. °Your incontinence gets worse. °Get help right away if: °You have a fever or chills. °You are unable to urinate. °You have redness in your groin area or  down your legs. °Summary °Urinary incontinence refers to a condition in which a person is unable to control where and when to pass urine. °This condition may be caused by medicines, infection, weak bladder muscles, weak pelvic floor muscles, enlargement of the prostate (in men), or surgery. °Factors such as older age, obesity, pregnancy and childbirth, menopause, neurological diseases, and chronic coughing may increase your risk for developing this condition. °Types of urinary incontinence include urge incontinence, stress incontinence, overflow incontinence, and functional incontinence. °This condition is usually treated first with lifestyle and behavioral changes, such as quitting smoking, eating a healthier diet, and doing regular pelvic floor exercises. Other treatment options include medicines, bulking agents, medical devices, electrical nerve stimulation, or surgery. °This information is not intended to replace advice given to you by your health care provider. Make sure you discuss any questions you have with your health care provider. °Document Revised: 10/03/2019 Document Reviewed: 10/03/2019 °Elsevier Patient Education © 2022 [employer] ° °

## 2021-05-18 ENCOUNTER — Ambulatory Visit
Admission: RE | Admit: 2021-05-18 | Discharge: 2021-05-18 | Disposition: A | Discharge status: Home / Self Care | Payer: 59 | Source: Ambulatory Visit | Attending: Advanced Practice Midwife | Admitting: Advanced Practice Midwife

## 2021-05-18 ENCOUNTER — Other Ambulatory Visit: Payer: Self-pay

## 2021-05-18 DIAGNOSIS — Z1231 Encounter for screening mammogram for malignant neoplasm of breast: Secondary | ICD-10-CM | POA: Insufficient documentation

## 2021-05-18 DIAGNOSIS — Z1239 Encounter for other screening for malignant neoplasm of breast: Secondary | ICD-10-CM

## 2021-06-28 ENCOUNTER — Telehealth: Payer: Self-pay

## 2021-06-28 DIAGNOSIS — F332 Major depressive disorder, recurrent severe without psychotic features: Secondary | ICD-10-CM

## 2021-06-28 NOTE — Telephone Encounter (Signed)
Request from [facility] received for refill of Venlafaxine XR capsules '75mg'$ , take 3 capsules po daily. Per request Last Fill Date was 01/04/2021.  ?

## 2021-07-08 MED ORDER — VENLAFAXINE HCL ER 75 MG PO CP24: 225.0000 mg | ORAL_CAPSULE | Freq: Every day | ORAL | 3 refills | Status: AC

## 2021-07-08 NOTE — Telephone Encounter (Addendum)
Venlafaxine XR refilled per JEG. ?

## 2022-03-01 ENCOUNTER — Other Ambulatory Visit: Payer: Self-pay | Admitting: Advanced Practice Midwife

## 2022-03-01 DIAGNOSIS — Z01419 Encounter for gynecological examination (general) (routine) without abnormal findings: Secondary | ICD-10-CM

## 2022-03-01 DIAGNOSIS — N951 Menopausal and female climacteric states: Secondary | ICD-10-CM

## 2022-07-10 IMAGING — MG MM DIGITAL SCREENING BILAT W/ TOMO AND CAD
8 series · 8 of 24 positions shown · non-contrast
Comparison: Previous exam(s).

CLINICAL DATA: Screening.

EXAM:
DIGITAL SCREENING BILATERAL MAMMOGRAM WITH TOMOSYNTHESIS AND CAD
TECHNIQUE: Bilateral screening digital craniocaudal and mediolateral oblique
mammograms were obtained. Bilateral screening digital breast
tomosynthesis was performed. The images were evaluated with
computer-aided detection.

[L MLO synth-2D]
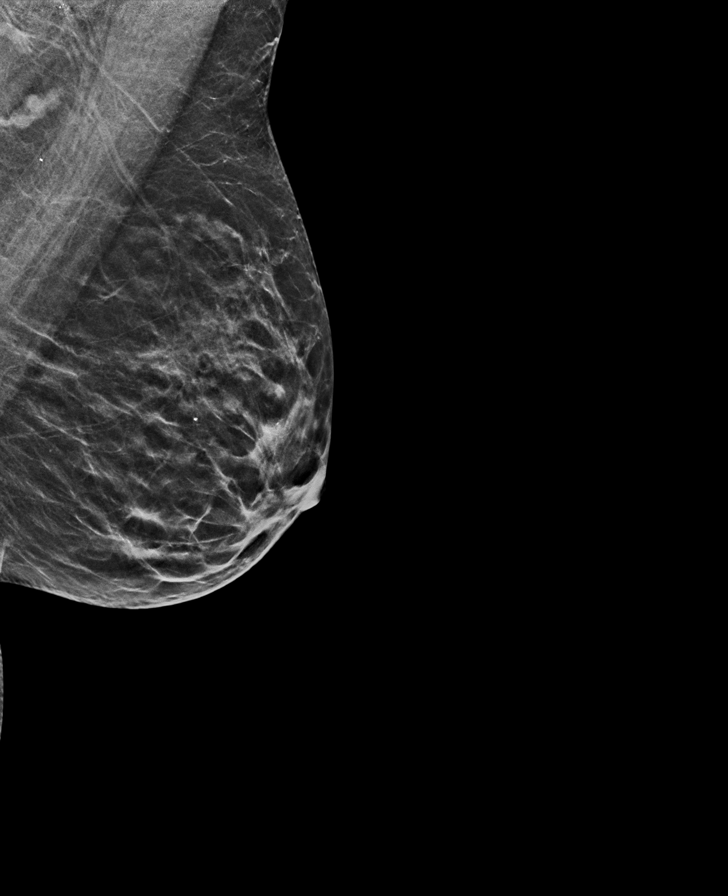

[R MLO synth-2D]
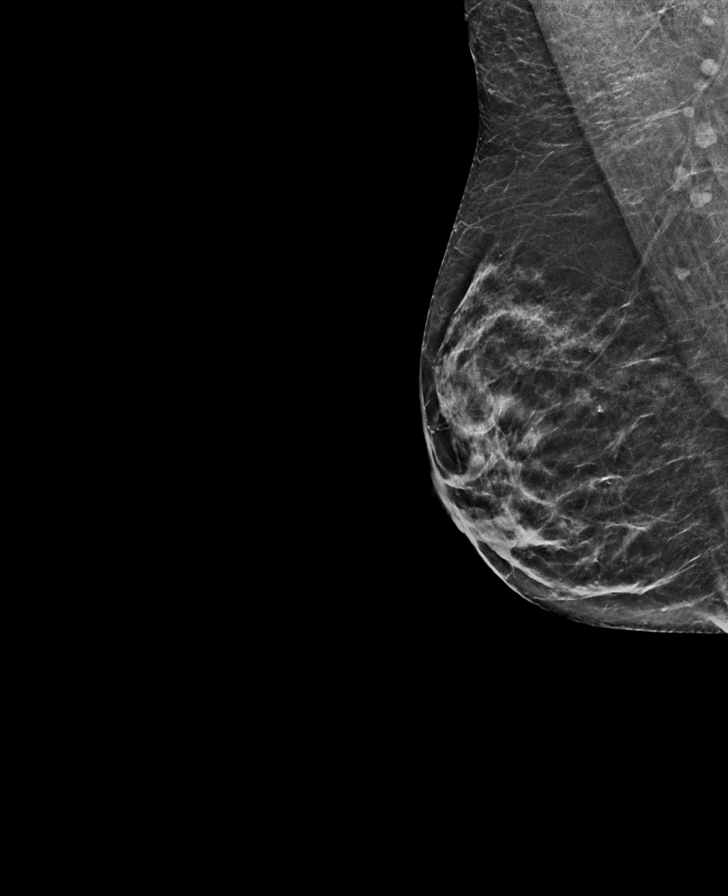

[R CC synth-2D]
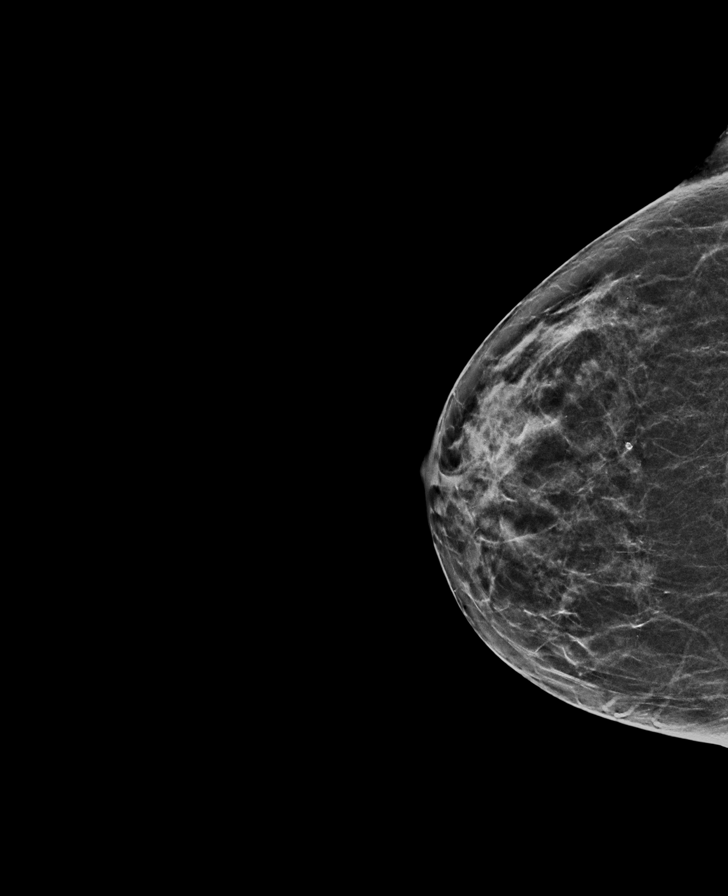

[L CC synth-2D]
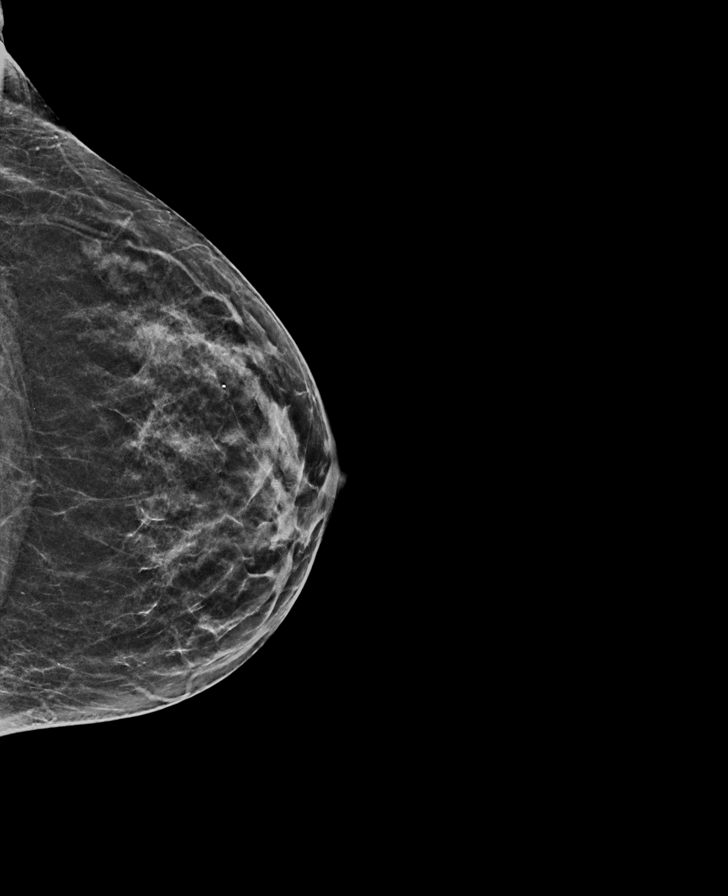

[L MLO tomo · tomo slice 28/55.0]
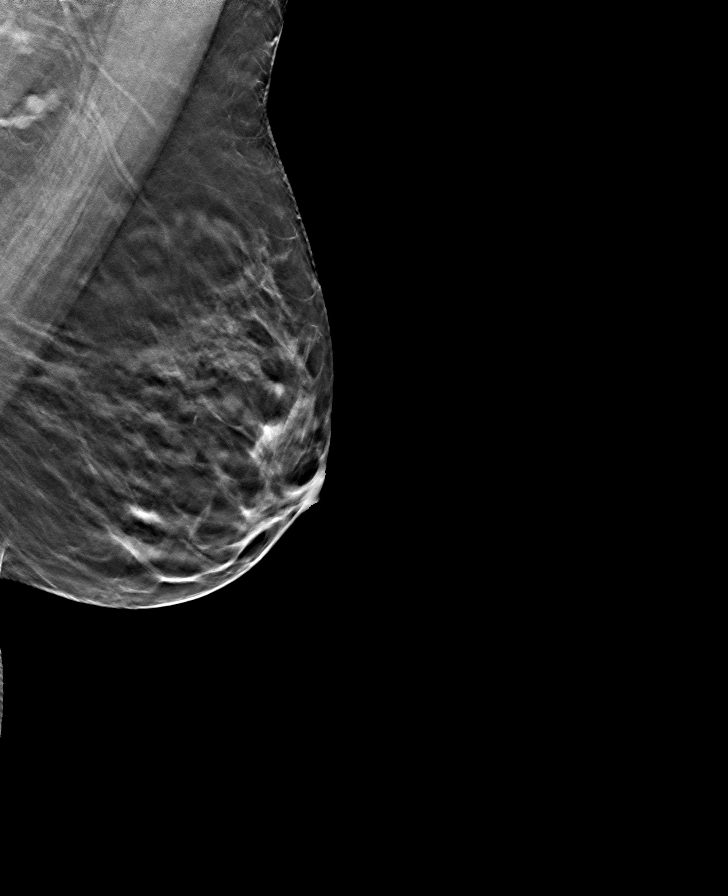

[R MLO tomo · tomo slice 29/57.0]
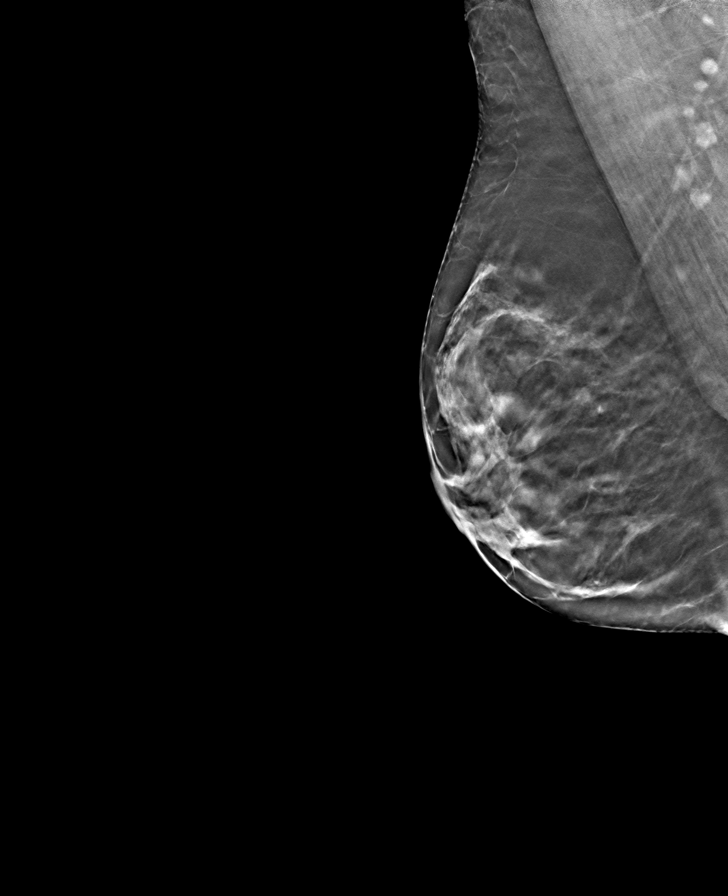

[L CC tomo · tomo slice 27/53.0]
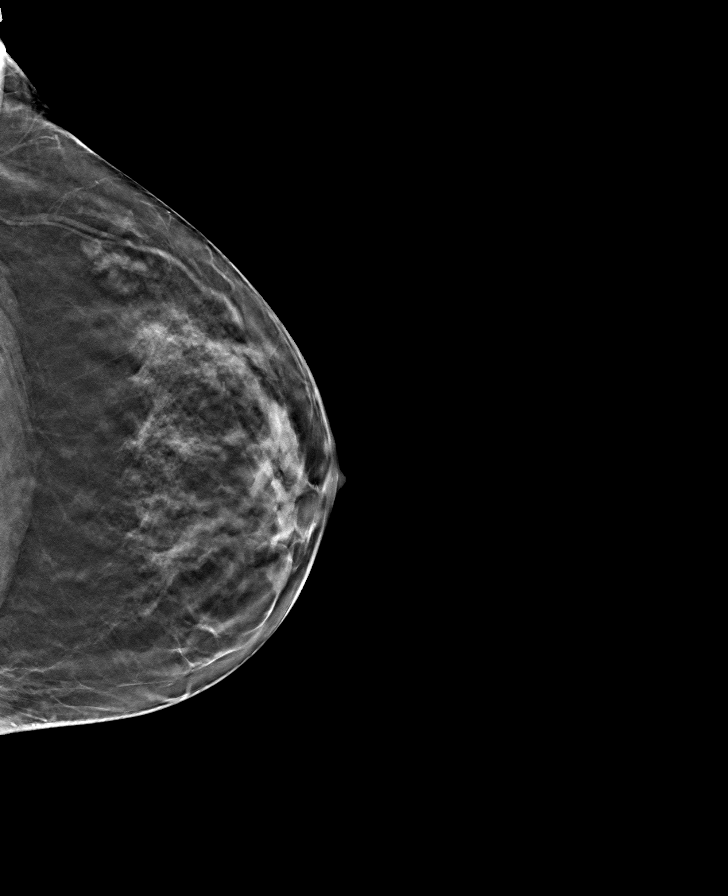

[R CC tomo · tomo slice 29/56.0]
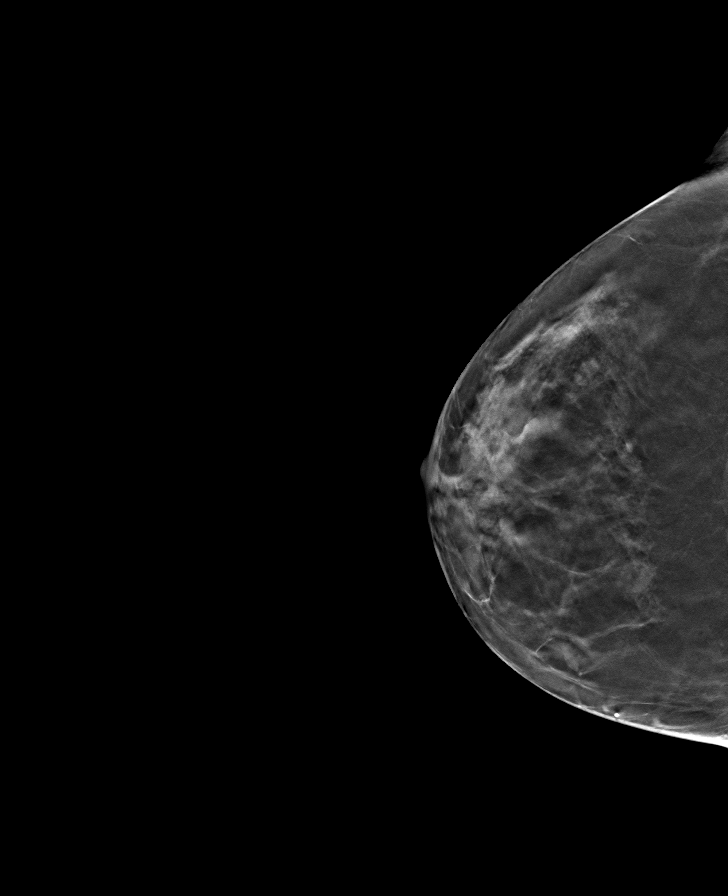

[8 of 24 positions shown; findings below may reference images not displayed]

ACR Breast Density Category c: The breast tissue is heterogeneously
dense, which may obscure small masses.
FINDINGS: There are no findings suspicious for malignancy.
IMPRESSION: No mammographic evidence of malignancy. A result letter of this
screening mammogram will be mailed directly to the patient.

RECOMMENDATION:
Screening mammogram in one year. (Code:[id number]-W-BC3)

BI-RADS CATEGORY  1: Negative.

## 2022-07-17 ENCOUNTER — Other Ambulatory Visit: Payer: Self-pay | Admitting: Family Medicine

## 2022-07-17 DIAGNOSIS — Z1231 Encounter for screening mammogram for malignant neoplasm of breast: Secondary | ICD-10-CM

## 2022-07-29 ENCOUNTER — Other Ambulatory Visit: Payer: Self-pay | Admitting: Advanced Practice Midwife

## 2022-07-29 DIAGNOSIS — F332 Major depressive disorder, recurrent severe without psychotic features: Secondary | ICD-10-CM

## 2022-09-22 ENCOUNTER — Other Ambulatory Visit: Payer: Self-pay | Admitting: Advanced Practice Midwife

## 2022-09-22 DIAGNOSIS — F332 Major depressive disorder, recurrent severe without psychotic features: Secondary | ICD-10-CM
